# Patient Record
Sex: Female | Born: 1945 | Race: White | Hispanic: No | Marital: Married | Smoking: Never smoker
Health system: Southern US, Community
[De-identification: ages and names within clinical notes are randomized; demographics above are authoritative.]

## PROBLEM LIST (undated history)

## (undated) DIAGNOSIS — F32A Depression, unspecified: Secondary | ICD-10-CM

## (undated) DIAGNOSIS — G629 Polyneuropathy, unspecified: Secondary | ICD-10-CM

## (undated) DIAGNOSIS — E119 Type 2 diabetes mellitus without complications: Secondary | ICD-10-CM

## (undated) DIAGNOSIS — F329 Major depressive disorder, single episode, unspecified: Secondary | ICD-10-CM

## (undated) DIAGNOSIS — I10 Essential (primary) hypertension: Secondary | ICD-10-CM

## (undated) HISTORY — PX: CHOLECYSTECTOMY: SHX55

## (undated) HISTORY — PX: KIDNEY DONATION: SHX685

## (undated) HISTORY — PX: BACK SURGERY: SHX140

## (undated) HISTORY — PX: APPENDECTOMY: SHX54

## (undated) HISTORY — PX: FOOT SURGERY: SHX648

## (undated) HISTORY — PX: HEMORRHOID SURGERY: SHX153

## (undated) HISTORY — PX: ABDOMINAL HYSTERECTOMY: SHX81

## (undated) HISTORY — PX: GASTRIC BYPASS: SHX52

---

## 2012-06-14 ENCOUNTER — Ambulatory Visit: Payer: Self-pay | Admitting: Internal Medicine

## 2012-06-16 ENCOUNTER — Ambulatory Visit: Payer: Self-pay | Admitting: Internal Medicine

## 2012-10-11 ENCOUNTER — Ambulatory Visit: Payer: Self-pay | Admitting: Internal Medicine

## 2015-05-28 ENCOUNTER — Emergency Department: Payer: Medicare Other

## 2015-05-28 ENCOUNTER — Emergency Department
Admission: EM | Admit: 2015-05-28 | Discharge: 2015-05-28 | Disposition: A | Discharge status: Home / Self Care | Payer: Medicare Other | Attending: Student | Admitting: Student

## 2015-05-28 ENCOUNTER — Encounter: Payer: Self-pay | Admitting: Emergency Medicine

## 2015-05-28 DIAGNOSIS — Z79899 Other long term (current) drug therapy: Secondary | ICD-10-CM | POA: Insufficient documentation

## 2015-05-28 DIAGNOSIS — W01198A Fall on same level from slipping, tripping and stumbling with subsequent striking against other object, initial encounter: Secondary | ICD-10-CM | POA: Diagnosis not present

## 2015-05-28 DIAGNOSIS — Y998 Other external cause status: Secondary | ICD-10-CM | POA: Diagnosis not present

## 2015-05-28 DIAGNOSIS — Y9289 Other specified places as the place of occurrence of the external cause: Secondary | ICD-10-CM | POA: Insufficient documentation

## 2015-05-28 DIAGNOSIS — S199XXA Unspecified injury of neck, initial encounter: Secondary | ICD-10-CM | POA: Diagnosis not present

## 2015-05-28 DIAGNOSIS — S0093XA Contusion of unspecified part of head, initial encounter: Secondary | ICD-10-CM

## 2015-05-28 DIAGNOSIS — Y9389 Activity, other specified: Secondary | ICD-10-CM | POA: Insufficient documentation

## 2015-05-28 DIAGNOSIS — I10 Essential (primary) hypertension: Secondary | ICD-10-CM | POA: Diagnosis not present

## 2015-05-28 DIAGNOSIS — S300XXA Contusion of lower back and pelvis, initial encounter: Secondary | ICD-10-CM | POA: Diagnosis not present

## 2015-05-28 DIAGNOSIS — S63502A Unspecified sprain of left wrist, initial encounter: Secondary | ICD-10-CM | POA: Diagnosis not present

## 2015-05-28 DIAGNOSIS — E119 Type 2 diabetes mellitus without complications: Secondary | ICD-10-CM | POA: Insufficient documentation

## 2015-05-28 DIAGNOSIS — W1809XA Striking against other object with subsequent fall, initial encounter: Secondary | ICD-10-CM

## 2015-05-28 DIAGNOSIS — S0990XA Unspecified injury of head, initial encounter: Secondary | ICD-10-CM | POA: Diagnosis present

## 2015-05-28 HISTORY — DX: Depression, unspecified: F32.A

## 2015-05-28 HISTORY — DX: Major depressive disorder, single episode, unspecified: F32.9

## 2015-05-28 HISTORY — DX: Polyneuropathy, unspecified: G62.9

## 2015-05-28 HISTORY — DX: Type 2 diabetes mellitus without complications: E11.9

## 2015-05-28 HISTORY — DX: Essential (primary) hypertension: I10

## 2015-05-28 LAB — CBC
HEMATOCRIT: 39.6 % (ref 35.0–47.0)
Hemoglobin: 13.1 g/dL (ref 12.0–16.0)
MCH: 28.7 pg (ref 26.0–34.0)
MCHC: 33.1 g/dL (ref 32.0–36.0)
MCV: 86.8 fL (ref 80.0–100.0)
Platelets: 287 10*3/uL (ref 150–440)
RBC: 4.56 MIL/uL (ref 3.80–5.20)
RDW: 17.2 % — ABNORMAL HIGH (ref 11.5–14.5)
WBC: 10.1 10*3/uL (ref 3.6–11.0)

## 2015-05-28 LAB — BASIC METABOLIC PANEL
Anion gap: 10 (ref 5–15)
BUN: 16 mg/dL (ref 6–20)
CO2: 25 mmol/L (ref 22–32)
CREATININE: 0.73 mg/dL (ref 0.44–1.00)
Calcium: 9.2 mg/dL (ref 8.9–10.3)
Chloride: 103 mmol/L (ref 101–111)
GFR calc non Af Amer: >60 mL/min (ref >60)
Glucose, Bld: 105 mg/dL — ABNORMAL HIGH (ref 65–99)
Potassium: 4.1 mmol/L (ref 3.5–5.1)
Sodium: 138 mmol/L (ref 135–145)

## 2015-05-28 LAB — TROPONIN I

## 2015-05-28 MED ORDER — METHOCARBAMOL 500 MG PO TABS: 500.0000 mg | ORAL_TABLET | Freq: Four times a day (QID) | ORAL | Status: AC | PRN

## 2015-05-28 MED ORDER — PROMETHAZINE HCL 25 MG PO TABS
25.0000 mg | ORAL_TABLET | Freq: Once | ORAL | Status: AC
  Administered 2015-05-28: 25 mg via ORAL
  Filled 2015-05-28: qty 1

## 2015-05-28 MED ORDER — KETOROLAC TROMETHAMINE 30 MG/ML IJ SOLN
30.0000 mg | Freq: Once | INTRAMUSCULAR | Status: AC
  Administered 2015-05-28: 30 mg via INTRAMUSCULAR
  Filled 2015-05-28: qty 1

## 2015-05-28 MED ORDER — OXYCODONE-ACETAMINOPHEN 5-325 MG PO TABS
2.0000 | ORAL_TABLET | Freq: Once | ORAL | Status: AC
  Administered 2015-05-28: 2 via ORAL
  Filled 2015-05-28: qty 2

## 2015-05-28 NOTE — Discharge Instructions (Signed)
Contusion A contusion is a deep bruise. Contusions are the result of an injury that caused bleeding under the skin. The contusion may turn blue, purple, or yellow. Minor injuries will give you a painless contusion, but more severe contusions may stay painful and swollen for a few weeks.  CAUSES  A contusion is usually caused by a blow, trauma, or direct force to an area of the body. SYMPTOMS   Swelling and redness of the injured area.  Bruising of the injured area.  Tenderness and soreness of the injured area.  Pain. DIAGNOSIS  The diagnosis can be made by taking a history and physical exam. An X-ray, CT scan, or MRI may be needed to determine if there were any associated injuries, such as fractures. TREATMENT  Specific treatment will depend on what area of the body was injured. In general, the best treatment for a contusion is resting, icing, elevating, and applying cold compresses to the injured area. Over-the-counter medicines may also be recommended for pain control. Ask your caregiver what the best treatment is for your contusion. HOME CARE INSTRUCTIONS   Put ice on the injured area.  Put ice in a plastic bag.  Place a towel between your skin and the bag.  Leave the ice on for 15-20 minutes, 3-4 times a day, or as directed by your health care provider.  Only take over-the-counter or prescription medicines for pain, discomfort, or fever as directed by your caregiver. Your caregiver may recommend avoiding anti-inflammatory medicines (aspirin, ibuprofen, and naproxen) for 48 hours because these medicines may increase bruising.  Rest the injured area.  If possible, elevate the injured area to reduce swelling. SEEK IMMEDIATE MEDICAL CARE IF:   You have increased bruising or swelling.  You have pain that is getting worse.  Your swelling or pain is not relieved with medicines. MAKE SURE YOU:   Understand these instructions.  Will watch your condition.  Will get help right  away if you are not doing well or get worse. Document Released: 08/12/2005 Document Revised: 11/07/2013 Document Reviewed: 09/07/2011 [facility] Patient Information 2015 [place], [place]. This information is not intended to replace advice given to you by your health care provider. Make sure you discuss any questions you have with your health care provider.  Head Injury You have received a head injury. It does not appear serious at this time. Headaches and vomiting are common following head injury. It should be easy to awaken from sleeping. Sometimes it is necessary for you to stay in the emergency department for a while for observation. Sometimes admission to the hospital may be needed. After injuries such as yours, most problems occur within the first 24 hours, but side effects may occur up to 7-10 days after the injury. It is important for you to carefully monitor your condition and contact your health care provider or seek immediate medical care if there is a change in your condition. WHAT ARE THE TYPES OF HEAD INJURIES? Head injuries can be as minor as a bump. Some head injuries can be more severe. More severe head injuries include:  A jarring injury to the brain (concussion).  A bruise of the brain (contusion). This mean there is bleeding in the brain that can cause swelling.  A cracked skull (skull fracture).  Bleeding in the brain that collects, clots, and forms a bump (hematoma). WHAT CAUSES A HEAD INJURY? A serious head injury is most likely to happen to someone who is in a car wreck and is not wearing  a seat belt. Other causes of major head injuries include bicycle or motorcycle accidents, sports injuries, and falls. HOW ARE HEAD INJURIES DIAGNOSED? A complete history of the event leading to the injury and your current symptoms will be helpful in diagnosing head injuries. Many times, pictures of the brain, such as CT or MRI are needed to see the extent of the injury. Often, an overnight  hospital stay is necessary for observation.  WHEN SHOULD I SEEK IMMEDIATE MEDICAL CARE?  You should get help right away if:  You have confusion or drowsiness.  You feel sick to your stomach (nauseous) or have continued, forceful vomiting.  You have dizziness or unsteadiness that is getting worse.  You have severe, continued headaches not relieved by medicine. Only take over-the-counter or prescription medicines for pain, fever, or discomfort as directed by your health care provider.  You do not have normal function of the arms or legs or are unable to walk.  You notice changes in the black spots in the center of the colored part of your eye (pupil).  You have a clear or bloody fluid coming from your nose or ears.  You have a loss of vision. During the next 24 hours after the injury, you must stay with someone who can watch you for the warning signs. This person should contact local emergency services (911 in the [place] if you have seizures, you become unconscious, or you are unable to wake up. HOW CAN I PREVENT A HEAD INJURY IN THE FUTURE? The most important factor for preventing major head injuries is avoiding motor vehicle accidents. To minimize the potential for damage to your head, it is crucial to wear seat belts while riding in motor vehicles. Wearing helmets while bike riding and playing collision sports (like football) is also helpful. Also, avoiding dangerous activities around the house will further help reduce your risk of head injury.  WHEN CAN I RETURN TO NORMAL ACTIVITIES AND ATHLETICS? You should be reevaluated by your health care provider before returning to these activities. If you have any of the following symptoms, you should not return to activities or contact sports until 1 week after the symptoms have stopped:  Persistent headache.  Dizziness or vertigo.  Poor attention and concentration.  Confusion.  Memory problems.  Nausea or vomiting.  Fatigue or tire  easily.  Irritability.  Intolerant of bright lights or loud noises.  Anxiety or depression.  Disturbed sleep. MAKE SURE YOU:   Understand these instructions.  Will watch your condition.  Will get help right away if you are not doing well or get worse. Document Released: 11/02/2005 Document Revised: 11/07/2013 Document Reviewed: 07/10/2013 [facility] Patient Information 2015 [place]. This information is not intended to replace advice given to you by your health care provider. Make sure you discuss any questions you have with your health care provider.  Myopathy Myopathy is a general term. It refers to any diseases of the muscles. The muscular dystrophies that run in families are one example. Another example are those diseases that produce redness, soreness, and swelling (inflammation) in the muscles. CAUSES  Myopathies can be acquired or passed down from parent to child (hereditary). It is unknown what causes the myopathies with inflammation. Myopathies can occur at birth or later in life. They may be caused by:  Endocrine disorders, such as thyroid disease.  Metabolic disorders, which are usually inherited.  Infection or inflammation of the muscle. This is often triggered by viruses or an immune system that attacks  the muscles.  Certain drugs, such as lipid-lowering medicines. SYMPTOMS  General symptoms include weakness or pain of the limbs. These feelings are usually present close to the center of the body (proximal). Some people report that their myopathy happens during exercise. In some cases, the symptoms decrease as exercise increases. Depending upon the type, one muscle group may be more affected than another. In some cases, people have a myopathy with no symptoms. In the inherited myopathies, some family members may not be affected by symptoms. Other family members may have a range of symptoms. DIAGNOSIS  Diagnosis is based on your exam and symptoms.  Often, blood  tests will be done. This is to measure muscle enzyme levels.  A test may be done to measure electrical activity of the muscle (electromyography, EMG).  A tissue sample (biopsy) of the affected muscle may be taken.  A computerized magnetic scan (MRI) may also be performed. TREATMENT  Treatments vary depending on the type of myopathy. In some cases, treatment to relieve symptoms may be all that is available or needed. Treatment for other forms of myopathy may include medicines. Immunosuppressive drugs and other disease-modifying antirheumatic drugs (DMARDs) may be used. Physical therapy, bracing, or surgery may be needed. HOME CARE INSTRUCTIONS   Certain diets and exercises may be encouraged depending on the type of myopathy.  Sun exposure may be discouraged as it can cause rashes.  Physical therapy with a muscle strengthening program may be advised.  It is important to practice good general health to maintain a normal body weight. SEEK IMMEDIATE MEDICAL CARE IF:   You develop breathing problems.  You develop a rash.  You have a fever. FOR MORE INFORMATION  National Institute of Neurological Disorders and Stroke: [place] Document Released: 10/23/2002 Document Revised: 01/25/2012 Document Reviewed: 02/13/2010 [facility] Patient Information 2015 [place], [place]. This information is not intended to replace advice given to you by your health care provider. Make sure you discuss any questions you have with your health care provider.

## 2015-05-28 NOTE — ED Provider Notes (Signed)
[facility] Emergency Department Provider Note  ____________________________________________  Time seen: Approximately 5:46 PM  I have reviewed the triage vital signs and the nursing notes.   HISTORY  Chief Complaint Fall; Wrist Pain; Tailbone Pain; and Head Injury    HPI March Rummageatricia Mullens is a 69 y.o. female who presents to the emergency room for evaluation of falling 2 today. Complains of hitting her head 2 and landing on her tailbone 2. Complains of posterior head pain, neck pain, left wrist pain, and tailbone pain. Denies any loss of consciousness or blurred vision.   Past Medical History  Diagnosis Date  . Neuropathy   . Depression   . Hypertension   . Diabetes mellitus without complication     There are no active problems to display for this patient.   Past Surgical History  Procedure Laterality Date  . Appendectomy    . Back surgery    . Cholecystectomy    . Abdominal hysterectomy    . Gastric bypass    . Kidney donation    . Foot surgery Right   . Hemorrhoid surgery      Current Outpatient Rx  Name  Route  Sig  Dispense  Refill  . buPROPion (WELLBUTRIN SR) 150 MG 12 hr tablet   Oral   Take 150 mg by mouth 2 (two) times daily.         . DULoxetine (CYMBALTA) 60 MG capsule   Oral   Take 60 mg by mouth daily.         Marland Kitchen. gabapentin (NEURONTIN) 300 MG capsule   Oral   Take 900 mg by mouth 3 (three) times daily.         Marland Kitchen. oxyCODONE-acetaminophen (PERCOCET) 10-325 MG per tablet   Oral   Take 1 tablet by mouth every 4 (four) hours as needed for pain.         . methocarbamol (ROBAXIN) 500 MG tablet   Oral   Take 1 tablet (500 mg total) by mouth every 6 (six) hours as needed for muscle spasms.   30 tablet   0     Allergies Review of patient's allergies indicates no known allergies.  No family history on file.  Social History History  Substance Use Topics  . Smoking status: Never Smoker   . Smokeless tobacco: Not  on file  . Alcohol Use: No    Review of Systems Constitutional: No fever/chills as a for headache Eyes: No visual changes. ENT: No sore throat. Cardiovascular: Denies chest pain. Respiratory: Denies shortness of breath. Gastrointestinal: No abdominal pain.  No nausea, no vomiting.  No diarrhea.  No constipation. Genitourinary: Negative for dysuria. Musculoskeletal: Positive for left wrist pain. Positive for coccyx pain. Positive for cervical neck pain. Skin: Negative for rash. Neurological: Positive for headaches, negative for focal weakness or numbness.  10-point ROS otherwise negative.  ____________________________________________   PHYSICAL EXAM:  VITAL SIGNS: ED Triage Vitals  Enc Vitals Group     BP 05/28/15 1634 137/88 mmHg     Pulse Rate 05/28/15 1634 68     Resp 05/28/15 1634 19     Temp 05/28/15 1634 97.6 F (36.4 C)     Temp Source 05/28/15 1634 Oral     SpO2 05/28/15 1634 96 %     Weight 05/28/15 1634 180 lb (81.647 kg)     Height 05/28/15 1634 5\' 6"  (1.676 m)     Head Cir --      Peak Flow --  Pain Score 05/28/15 1634 10     Pain Loc --      Pain Edu? --      Excl. in GC? --     Constitutional: Alert and oriented. Well appearing and in no acute distress. Eyes: Conjunctivae are normal. PERRL. EOMI. Head: Atraumatic in appearance. Some point tenderness posteriorly to the occipital region. Nose: No congestion/rhinnorhea. Mouth/Throat: Mucous membranes are moist.  Oropharynx non-erythematous. Neck: No stridor. No cervical spine tenderness. Full range of motion   Cardiovascular: Normal rate, regular rhythm. Grossly normal heart sounds.  Good peripheral circulation. Respiratory: Normal respiratory effort.  No retractions. Lungs CTAB. Musculoskeletal: No lower extremity tenderness nor edema.  No joint effusions. Positive for tailbone tenderness. No ecchymosis or bruising noted. Left wrist with appears to be deformity however there is no ecchymosis. There is  tenderness to palpation with limited range of motion from pronation to supination. Distally she is neurovascularly intact. Neurologic:  Normal speech and language. No gross focal neurologic deficits are appreciated. Speech is normal. No gait instability. Skin:  Skin is warm, dry and intact. No rash noted. Psychiatric: Mood and affect are normal. Speech and behavior are normal.  ____________________________________________   LABS (all labs ordered are listed, but only abnormal results are displayed)  Labs Reviewed  BASIC METABOLIC PANEL - Abnormal; Notable for the following:    Glucose, Bld 105 (*)    All other components within normal limits  CBC - Abnormal; Notable for the following:    RDW 17.2 (*)    All other components within normal limits  TROPONIN I   ____________________________________________  RADIOLOGY  Head CT negative, cervical CT negative, left wrist negative, chest x-ray negative, coccyx 6 negative. All images interpreted by radiologist and reviewed by myself. ____________________________________________   PROCEDURES  Procedure(s) performed: None  Critical Care performed: No  ____________________________________________   INITIAL IMPRESSION / ASSESSMENT AND PLAN / ED COURSE  Pertinent labs & imaging results that were available during my care of the patient were reviewed by me and considered in my medical decision making (see chart for details).  Status post fall with head contusion, left wrist strain, coccyx contusion. Discussed all radiological findings with the patient provided reassurance. She voices no other emergency medical complaints at this time and will follow-up with her PCP as needed. Patient has Percocet available at home so no prescription was provided. ____________________________________________   FINAL CLINICAL IMPRESSION(S) / ED DIAGNOSES  Final diagnoses:  Fall against object, initial encounter  Head contusion, initial encounter   Coccyx contusion, initial encounter  Wrist sprain, left, initial encounter      Evangeline Dakin, PA-C 05/28/15 2000  Gayla Doss, MD 05/28/15 2352

## 2015-05-28 NOTE — ED Notes (Signed)
Pt ambulatory to triage with c/o fall x2. Pt reports hitting back of head on bedpost and then floor. Pt reports pain to left hand and wrist. Pt denies LOC, reports pressure to back of head. Pt reports taking  of tylenol; pt denies lightheaded, dizziness. Pt also reports pain to tailbone.

## 2015-05-28 NOTE — ED Notes (Signed)
Patient with no complaints at this time. Respirations even and unlabored. Skin warm/dry. Discharge instructions reviewed with patient at this time. Patient given opportunity to voice concerns/ask questions. Patient discharged at this time and left Emergency Department with steady gait.   

## 2016-08-17 IMAGING — CR DG SACRUM/COCCYX 2+V
3 series · 3 of 3 positions shown · non-contrast
Comparison: None.

CLINICAL DATA: Fall with distal sacral/coccygeal pain. Initial
encounter.

EXAM:
SACRUM AND COCCYX - 2+ VIEW

[coccyx ap]
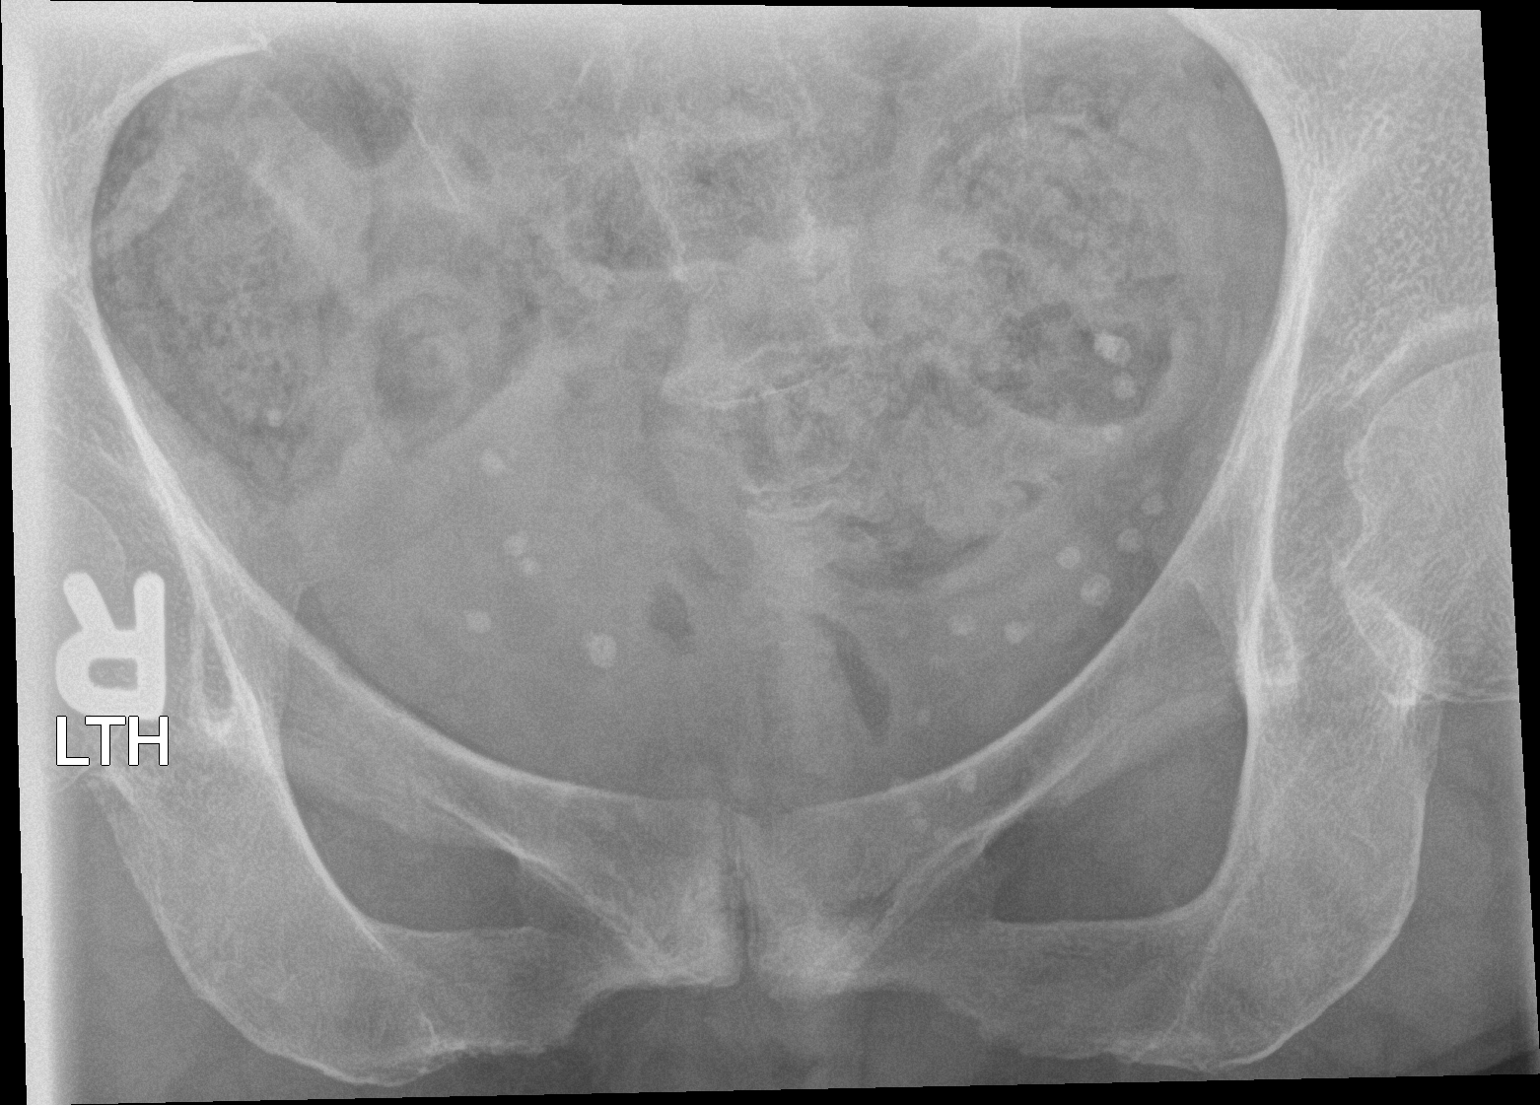

[sacrum ap]
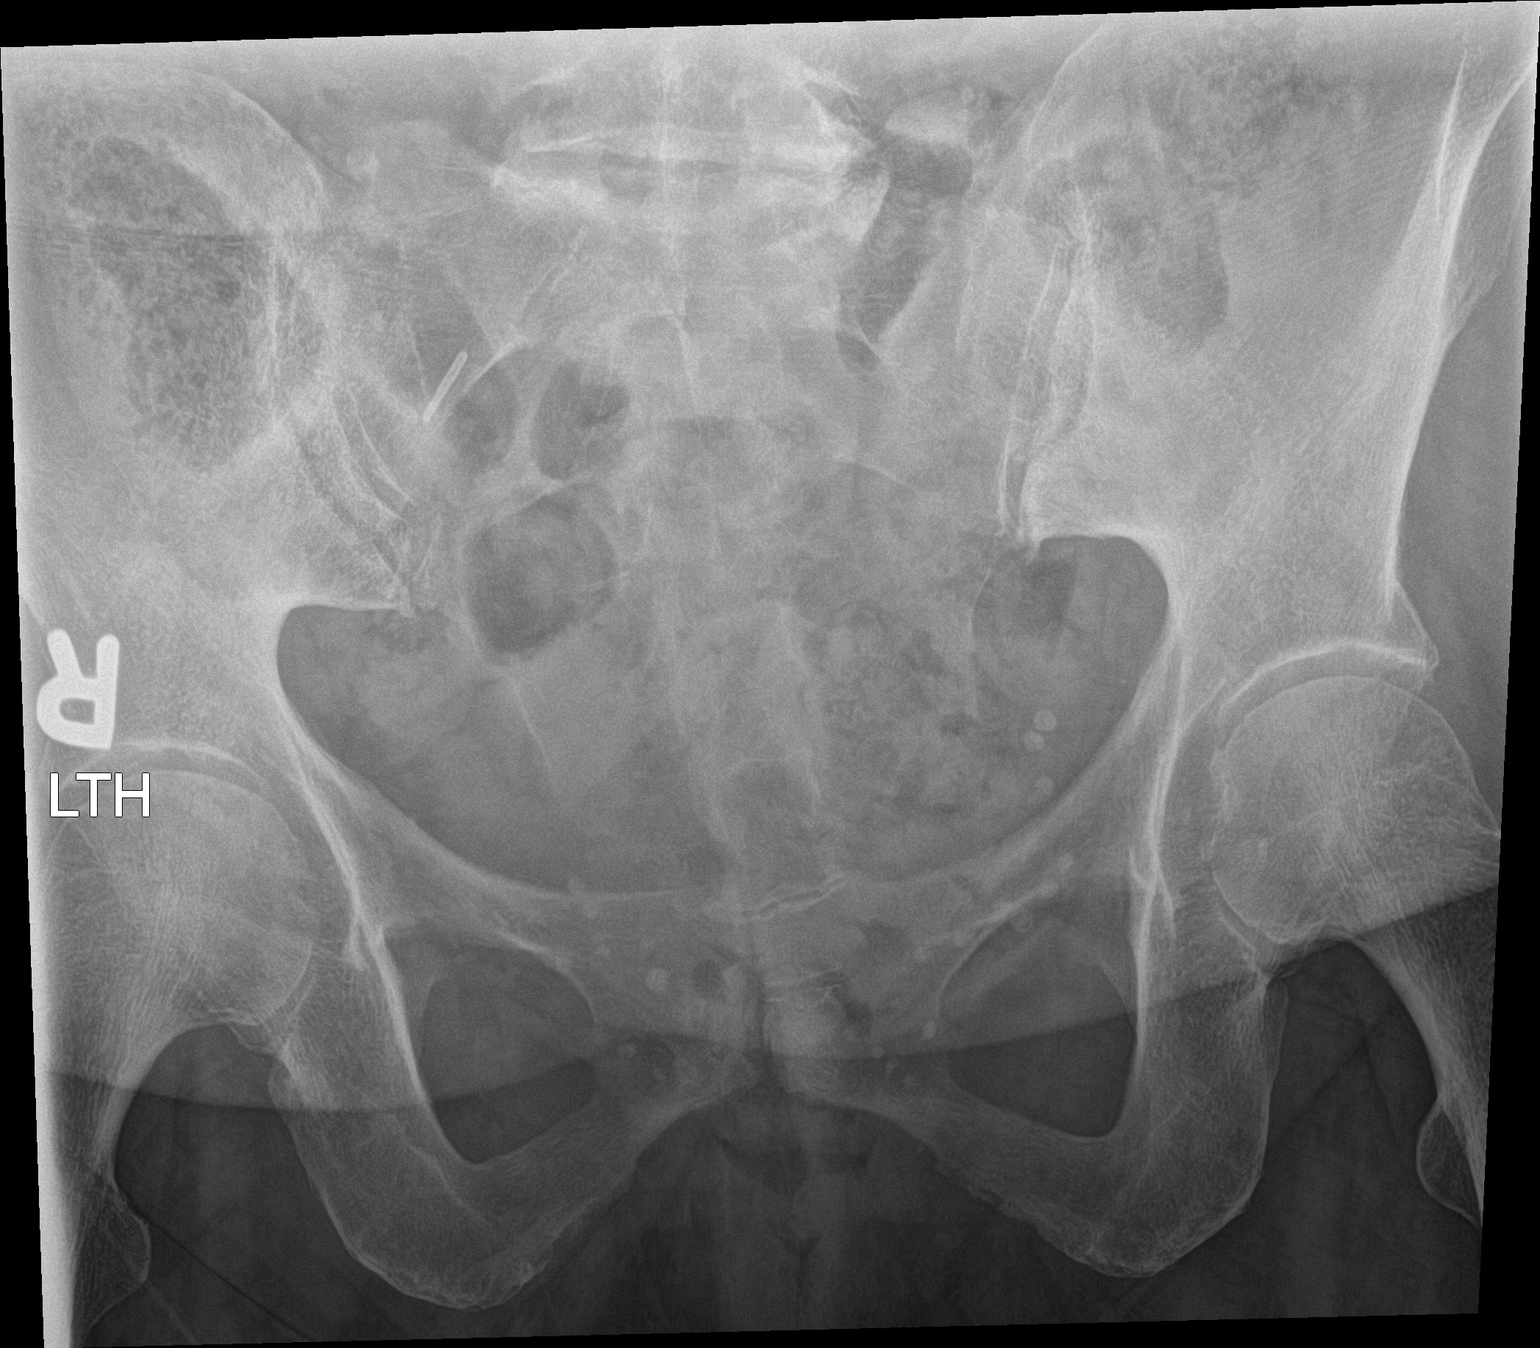

[sacrum lat]
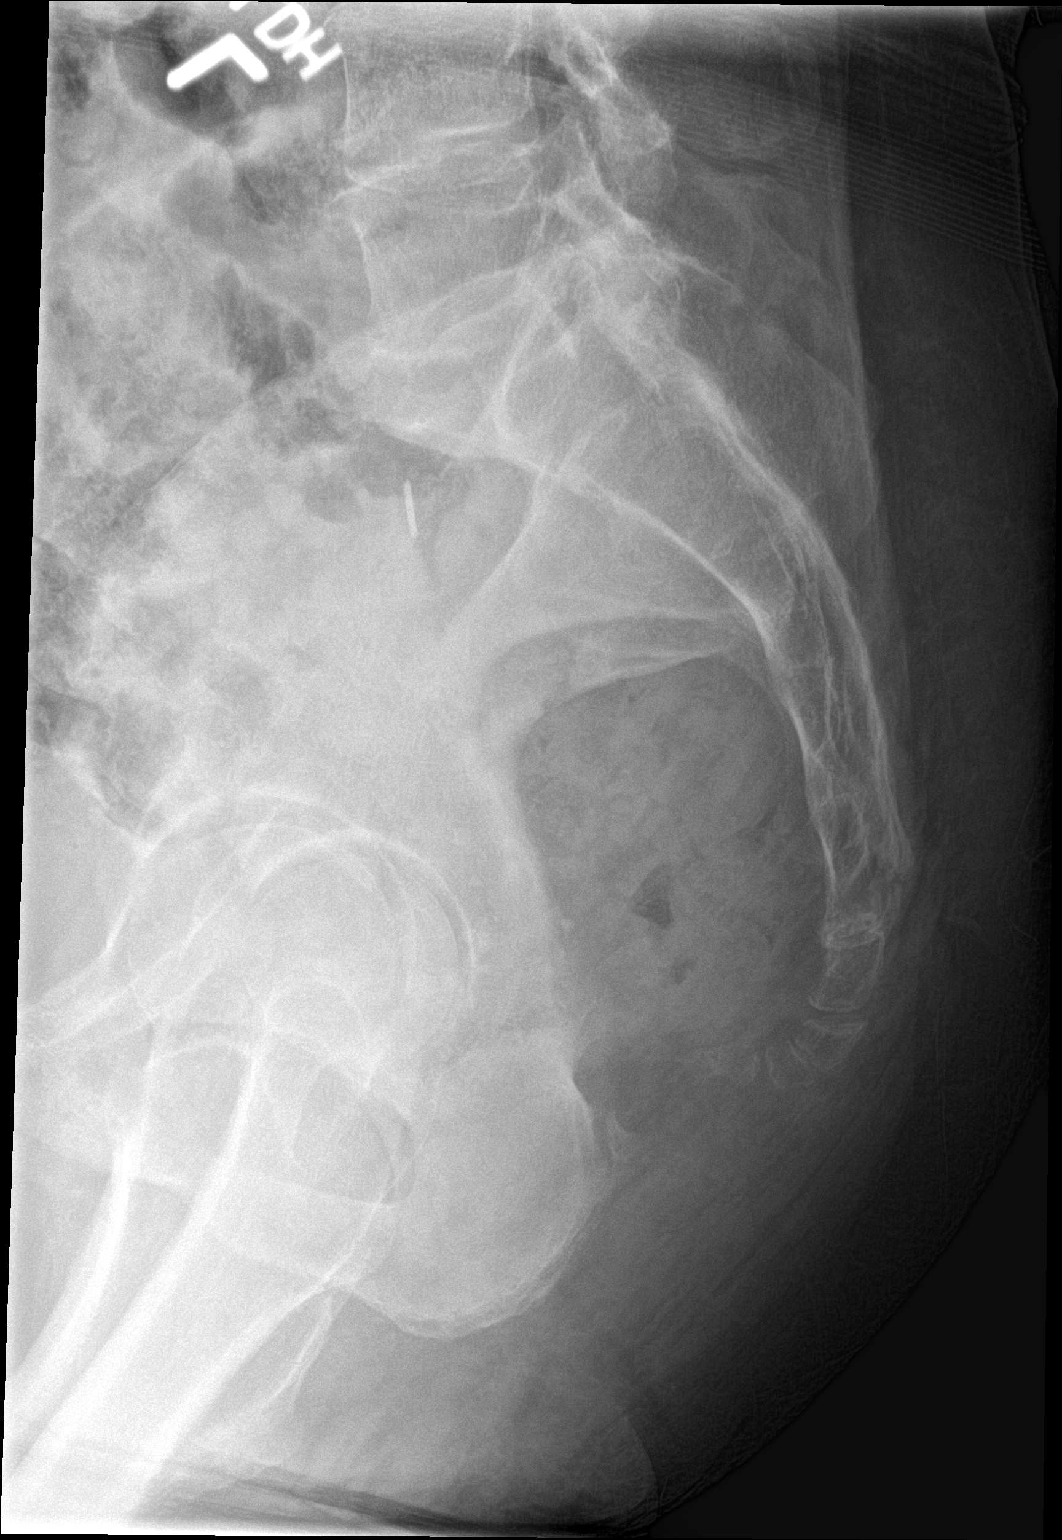

[3 of 3 positions shown; findings below may reference images not displayed]

FINDINGS: No evidence of acute fracture or dislocation. Sacroiliac joints have
a normal and symmetric appearance bilaterally. No bony lesions are
seen.
IMPRESSION: No acute fracture identified.

## 2016-08-17 IMAGING — CR DG WRIST COMPLETE 3+V*L*
4 series · 4 of 4 positions shown · non-contrast
Comparison: None.

CLINICAL DATA: Fall, headache, left hand and left wrist pain

EXAM:
LEFT WRIST - COMPLETE 3+ VIEW

[wrist pa]
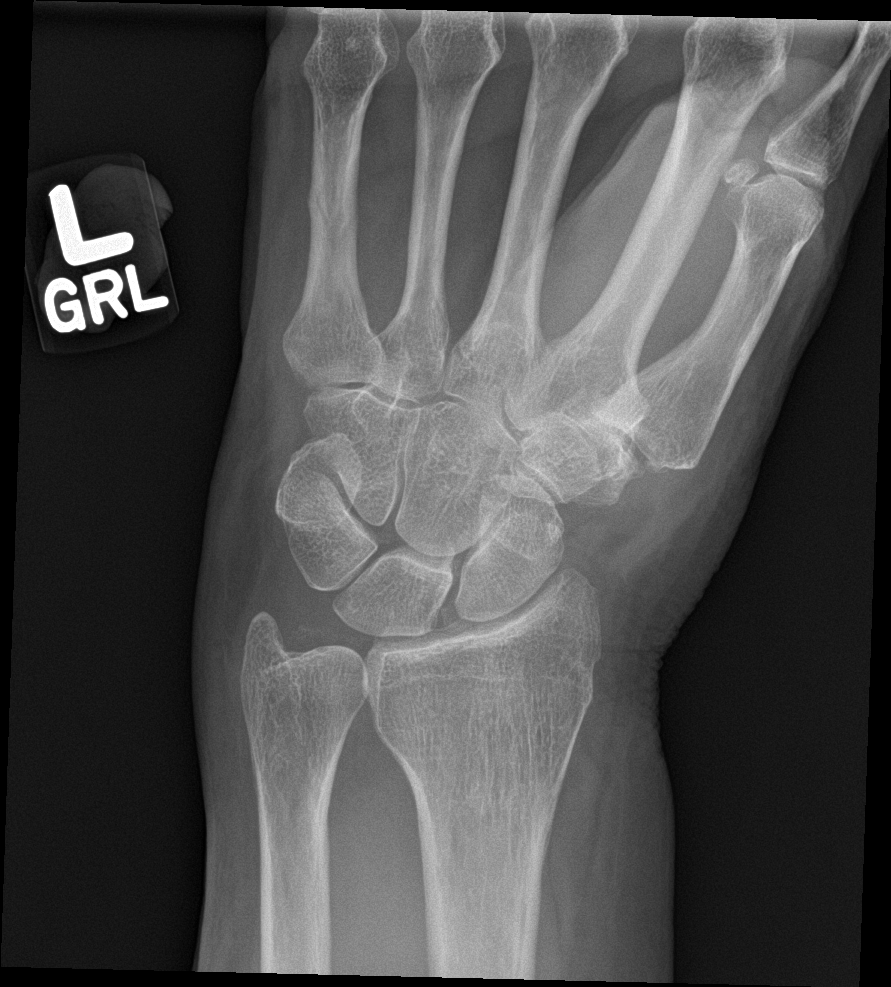

[wrist obl]
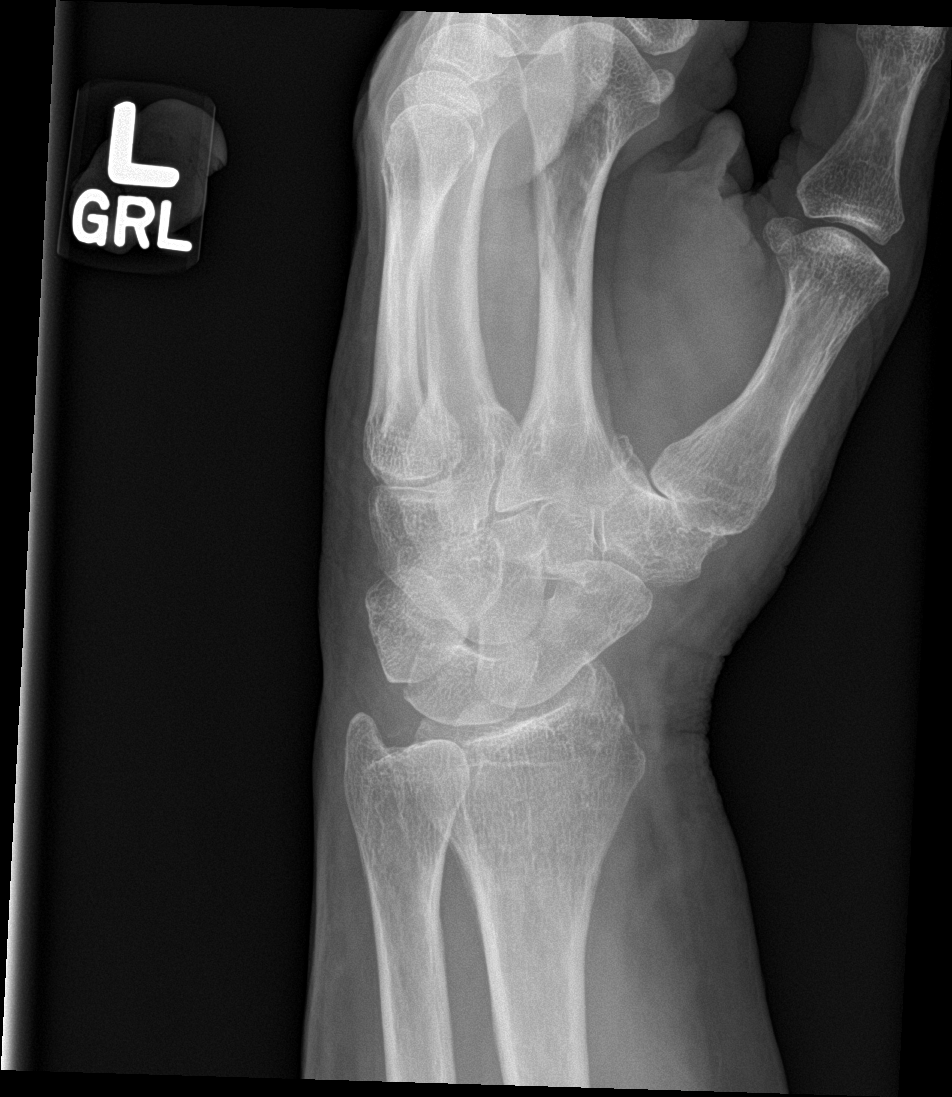

[wrist lat]
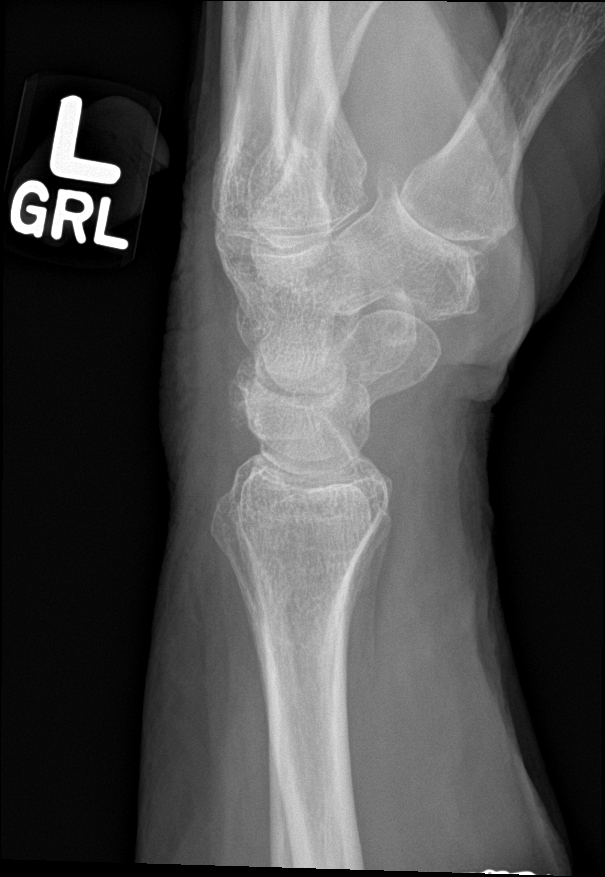

[navicular]
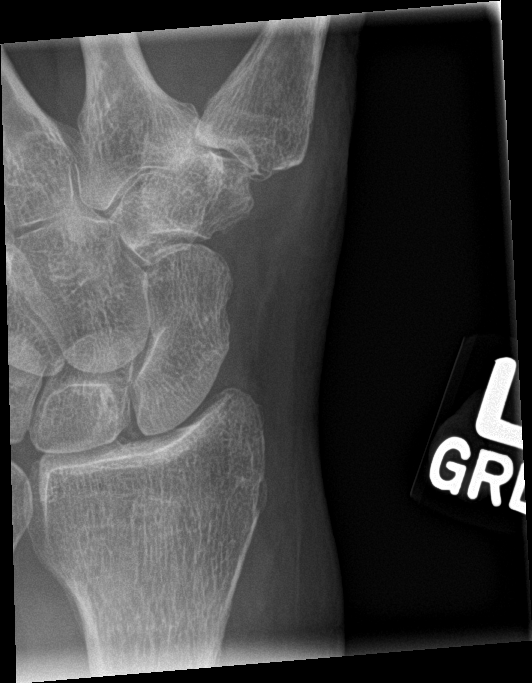

[4 of 4 positions shown; findings below may reference images not displayed]

FINDINGS: Four views of the left wrist submitted. No acute fracture or
subluxation. No radiopaque foreign body. Mild narrowing of
radiocarpal joint space. Mild chondrocalcinosis. Mild degenerative
changes first carpometacarpal joint.
IMPRESSION: No acute fracture or subluxation. Mild degenerative changes. Mild
chondrocalcinosis.

## 2017-04-16 DEATH — deceased
